# Patient Record
Sex: Male | Born: 1965
Health system: Southern US, Community
[De-identification: ages and names within clinical notes are randomized; demographics above are authoritative.]

## PROBLEM LIST (undated history)

## (undated) DIAGNOSIS — C801 Malignant (primary) neoplasm, unspecified: Secondary | ICD-10-CM

## (undated) DIAGNOSIS — F419 Anxiety disorder, unspecified: Secondary | ICD-10-CM

---

## 1983-08-29 HISTORY — PX: ELBOW FRACTURE SURGERY: SHX616

## 1987-08-29 HISTORY — PX: TESTICLE SURGERY: SHX794

## 2005-05-09 ENCOUNTER — Ambulatory Visit (HOSPITAL_COMMUNITY): Admission: RE | Admit: 2005-05-09 | Discharge: 2005-05-09 | Payer: Self-pay | Admitting: Family Medicine

## 2010-02-01 ENCOUNTER — Ambulatory Visit (HOSPITAL_COMMUNITY): Payer: Self-pay | Admitting: Psychiatry

## 2010-02-03 ENCOUNTER — Ambulatory Visit (HOSPITAL_COMMUNITY): Payer: Self-pay | Admitting: Psychology

## 2010-02-18 ENCOUNTER — Ambulatory Visit (HOSPITAL_COMMUNITY): Payer: Self-pay | Admitting: Psychology

## 2014-03-02 ENCOUNTER — Encounter (HOSPITAL_COMMUNITY): Payer: Self-pay | Admitting: Pharmacy Technician

## 2014-03-02 NOTE — H&P (Signed)
  NTS SOAP Note  Vital Signs:  Vitals as of: 09/01/7260: Systolic 035: Diastolic 96: Heart Rate 82: Temp 98.80F: Height 93ft 11in: Weight 189Lbs 0 Ounces: Pain Level 4: BMI 26.36  BMI : 26.36 kg/m2  Subjective: This 48 Years 1 Months old Male presents for of an incisional hernia.  Is at the umbilicus.  Had laparotomy in remote past.  Hernia is enlarging and causing him discomfort.  Review of Symptoms:  Constitutional:unremarkable      headache Eyes:unremarkable       sinus problems, sore throat Respiratory:  dyspnea,cough Gastrointestin    abdominal pain Genitourinary:    frequency   joint and neck pain Skin:unremarkable Hematolgic/Lymphatic:unremarkable     Allergic/Immunologic:unremarkable     Past Medical History:    Reviewed  Past Medical History  Surgical History: explratory laparotomy in remote past Medical Problems: none Psychiatric History: Anxiety Allergies: nkda Medications: xanax   Social History:Reviewed  Social History  Preferred Language: English Race:  White Ethnicity: Not Hispanic / Latino Age: 48 Years 1 Months Marital Status:  D Alcohol: no   Smoking Status: Current every day smoker reviewed on 02/27/2014 Started Date:  Packs per day: 1.00 Functional Status reviewed on 02/27/2014 ------------------------------------------------ Bathing: Normal Cooking: Normal Dressing: Normal Driving: Normal Eating: Normal Managing Meds: Normal Oral Care: Normal Shopping: Normal Toileting: Normal Transferring: Normal Walking: Normal Cognitive Status reviewed on 02/27/2014 ------------------------------------------------ Attention: Normal Decision Making: Normal Language: Normal Memory: Normal Motor: Normal Perception: Normal Problem Solving: Normal Visual and Spatial: Normal   Family History:  Reviewed  Family Health History Family History is Unknown    Objective Information: General:  Well  appearing, well nourished in no distress. Heart:  RRR, no murmur Lungs:    CTA bilaterally, no wheezes, rhonchi, rales.  Breathing unlabored. Abdomen:Soft, NT/ND, normal bowel sounds, no HSM, no masses.  No peritoneal signs.  Reducible incisional hernia at the level of the umbilicus.  Assessment:Incisional hernia  Diagnoses: 553.21 Incisional hernia (Incisional hernia without obstruction or gangrene)  Procedures: 59741 - OFFICE OUTPATIENT NEW 30 MINUTES    Plan:  Scheduled for incisional herniorrhaphy with mesh on 03/13/14.   Patient Education:Alternative treatments to surgery were discussed with patient (and family).  Risks and benefits  of procedure including bleeding, infection, and recurrence of the hernia were fully explained to the patient (and family) who gave informed consent. Patient/family questions were addressed.  Follow-up:Pending Surgery

## 2014-03-05 NOTE — Patient Instructions (Signed)
Your procedure is scheduled on: March 13, 2014  Report to [facility] at   6:15  AM.  Call this number if you have problems the morning of surgery: [phone]   Remember:   Do not drink or eat food:After Midnight.  :  Take these medicines the morning of surgery with A SIP OF WATER: Xanax   Do not wear jewelry, make-up or nail polish.  Do not wear lotions, powders, or perfumes. You may wear deodorant.  Do not shave 48 hours prior to surgery. Men may shave face and neck.  Do not bring valuables to the hospital.  Contacts, dentures or bridgework may not be worn into surgery.  Leave suitcase in the car. After surgery it may be brought to your room.  For patients admitted to the hospital, checkout time is 11:00 AM the day of discharge.   Patients discharged the day of surgery will not be allowed to drive home.    Special Instructions: Shower using CHG night before surgery and shower the day of surgery use CHG.  Use special wash - you have one bottle of CHG for all showers.  You should use approximately 1/2 of the bottle for each shower.   Please read over the following fact sheets that you were given: Pain Booklet, MRSA Information, Surgical Site Infection Prevention and Care and Recovery After Surgery  Ventral Hernia A ventral hernia (also called an incisional hernia) is a hernia that occurs at the site of a previous surgical cut (incision) in the abdomen. The abdominal wall spans from your lower chest down to your pelvis. If the abdominal wall is weakened from a surgical incision, a hernia can occur. A hernia is a bulge of bowel or muscle tissue pushing out on the weakened part of the abdominal wall. Ventral hernias can get bigger from straining or lifting. Obese and older people are at higher risk for a ventral hernia. People who develop infections after surgery or require repeat incisions at the same site on the abdomen are also at increased risk. CAUSES  A ventral hernia occurs because of  weakness in the abdominal wall at an incision site.  SYMPTOMS  Common symptoms include:  A visible bulge or lump on the abdominal wall.  Pain or tenderness around the lump.  Increased discomfort if you cough or make a sudden movement. If the hernia has blocked part of the intestine, a serious complication can occur (incarcerated or strangulated hernia). This can become a problem that requires emergency surgery because the blood flow to the blocked intestine may be cut off. Symptoms may include:  Feeling sick to your stomach (nauseous).  Throwing up (vomiting).  Stomach swelling (distention) or bloating.  Fever.  Rapid heartbeat. DIAGNOSIS  Your caregiver will take a medical history and perform a physical exam. Various tests may be ordered, such as:  Blood tests.  Urine tests.  Ultrasonography.  X-rays.  Computed tomography (CT). TREATMENT  Watchful waiting may be all that is needed for a smaller hernia that does not cause symptoms. Your caregiver may recommend the use of a supportive belt (truss) that helps to keep the abdominal wall intact. For larger hernias or those that cause pain, surgery to repair the hernia is usually recommended. If a hernia becomes strangulated, emergency surgery needs to be done right away. HOME CARE INSTRUCTIONS  Avoid putting pressure or strain on the abdominal area.  Avoid heavy lifting.  Use good body positioning for physical tasks. Ask your caregiver about proper  body positioning.  Use a supportive belt as directed by your caregiver.  Maintain a healthy weight.  Eat foods that are high in fiber, such as whole grains, fruits, and vegetables. Fiber helps prevent difficult bowel movements (constipation).  Drink enough fluids to keep your urine clear or pale yellow.  Follow up with your caregiver as directed. SEEK MEDICAL CARE IF:   Your hernia seems to be getting larger or more painful. SEEK IMMEDIATE MEDICAL CARE IF:   You have  abdominal pain that is sudden and sharp.  Your pain becomes severe.  You have repeated vomiting.  You are sweating a lot.  You notice a rapid heartbeat.  You develop a fever. MAKE SURE YOU:   Understand these instructions.  Will watch your condition.  Will get help right away if you are not doing well or get worse. Document Released: 07/31/2012 Document Reviewed: 07/31/2012 [facility] Patient Information 2015 [place]. This information is not intended to replace advice given to you by your health care provider. Make sure you discuss any questions you have with your health care provider. PATIENT INSTRUCTIONS POST-ANESTHESIA  IMMEDIATELY FOLLOWING SURGERY:  Do not drive or operate machinery for the first twenty four hours after surgery.  Do not make any important decisions for twenty four hours after surgery or while taking narcotic pain medications or sedatives.  If you develop intractable nausea and vomiting or a severe headache please notify your doctor immediately.  FOLLOW-UP:  Please make an appointment with your surgeon as instructed. You do not need to follow up with anesthesia unless specifically instructed to do so.  WOUND CARE INSTRUCTIONS (if applicable):  Keep a dry clean dressing on the anesthesia/puncture wound site if there is drainage.  Once the wound has quit draining you may leave it open to air.  Generally you should leave the bandage intact for twenty four hours unless there is drainage.  If the epidural site drains for more than 36-48 hours please call the anesthesia department.  QUESTIONS?:  Please feel free to call your physician or the hospital operator if you have any questions, and they will be happy to assist you.

## 2014-03-06 ENCOUNTER — Encounter (HOSPITAL_COMMUNITY): Payer: Self-pay

## 2014-03-06 ENCOUNTER — Other Ambulatory Visit: Payer: Self-pay

## 2014-03-06 ENCOUNTER — Encounter (HOSPITAL_COMMUNITY)
Admission: RE | Admit: 2014-03-06 | Discharge: 2014-03-06 | Disposition: A | Discharge status: Home / Self Care | Payer: BC Managed Care – PPO | Source: Ambulatory Visit | Attending: General Surgery | Admitting: General Surgery

## 2014-03-06 DIAGNOSIS — Z01812 Encounter for preprocedural laboratory examination: Secondary | ICD-10-CM | POA: Insufficient documentation

## 2014-03-06 DIAGNOSIS — Z01810 Encounter for preprocedural cardiovascular examination: Secondary | ICD-10-CM | POA: Insufficient documentation

## 2014-03-06 HISTORY — DX: Malignant (primary) neoplasm, unspecified: C80.1

## 2014-03-06 HISTORY — DX: Anxiety disorder, unspecified: F41.9

## 2014-03-06 LAB — CBC WITH DIFFERENTIAL/PLATELET
BASOS ABS: 0 10*3/uL (ref 0.0–0.1)
Basophils Relative: 0 % (ref 0–1)
EOS PCT: 2 % (ref 0–5)
Eosinophils Absolute: 0.2 10*3/uL (ref 0.0–0.7)
HCT: 46.7 % (ref 39.0–52.0)
HEMOGLOBIN: 16 g/dL (ref 13.0–17.0)
LYMPHS ABS: 2.4 10*3/uL (ref 0.7–4.0)
LYMPHS PCT: 22 % (ref 12–46)
MCH: 31.6 pg (ref 26.0–34.0)
MCHC: 34.3 g/dL (ref 30.0–36.0)
MCV: 92.1 fL (ref 78.0–100.0)
MONOS PCT: 8 % (ref 3–12)
Monocytes Absolute: 0.9 10*3/uL (ref 0.1–1.0)
NEUTROS PCT: 68 % (ref 43–77)
Neutro Abs: 7.5 10*3/uL (ref 1.7–7.7)
PLATELETS: 317 10*3/uL (ref 150–400)
RBC: 5.07 MIL/uL (ref 4.22–5.81)
RDW: 12.7 % (ref 11.5–15.5)
WBC: 11 10*3/uL — ABNORMAL HIGH (ref 4.0–10.5)

## 2014-03-06 LAB — BASIC METABOLIC PANEL
ANION GAP: 11 (ref 5–15)
BUN: 13 mg/dL (ref 6–23)
CO2: 26 meq/L (ref 19–32)
CREATININE: 0.99 mg/dL (ref 0.50–1.35)
Calcium: 9.6 mg/dL (ref 8.4–10.5)
Chloride: 104 mEq/L (ref 96–112)
GFR calc Af Amer: >90 mL/min (ref >90)
GLUCOSE: 105 mg/dL — AB (ref 70–99)
Potassium: 4.1 mEq/L (ref 3.7–5.3)
SODIUM: 141 meq/L (ref 137–147)

## 2014-03-09 ENCOUNTER — Other Ambulatory Visit (HOSPITAL_COMMUNITY): Payer: Self-pay

## 2014-03-13 ENCOUNTER — Encounter (HOSPITAL_COMMUNITY): Payer: Self-pay | Admitting: *Deleted

## 2014-03-13 ENCOUNTER — Encounter (HOSPITAL_COMMUNITY)
Admission: RE | Disposition: A | Discharge status: Home / Self Care | Payer: Self-pay | Source: Ambulatory Visit | Attending: General Surgery

## 2014-03-13 ENCOUNTER — Ambulatory Visit (HOSPITAL_COMMUNITY)
Admission: RE | Admit: 2014-03-13 | Discharge: 2014-03-13 | Disposition: A | Discharge status: Home / Self Care | Payer: BC Managed Care – PPO | Source: Ambulatory Visit | Attending: General Surgery | Admitting: General Surgery

## 2014-03-13 ENCOUNTER — Encounter (HOSPITAL_COMMUNITY): Payer: BC Managed Care – PPO | Admitting: Anesthesiology

## 2014-03-13 ENCOUNTER — Ambulatory Visit (HOSPITAL_COMMUNITY): Payer: BC Managed Care – PPO | Admitting: Anesthesiology

## 2014-03-13 DIAGNOSIS — F17200 Nicotine dependence, unspecified, uncomplicated: Secondary | ICD-10-CM | POA: Insufficient documentation

## 2014-03-13 DIAGNOSIS — F411 Generalized anxiety disorder: Secondary | ICD-10-CM | POA: Insufficient documentation

## 2014-03-13 DIAGNOSIS — K432 Incisional hernia without obstruction or gangrene: Secondary | ICD-10-CM | POA: Insufficient documentation

## 2014-03-13 DIAGNOSIS — Z79899 Other long term (current) drug therapy: Secondary | ICD-10-CM | POA: Insufficient documentation

## 2014-03-13 HISTORY — PX: INCISIONAL HERNIA REPAIR: SHX193

## 2014-03-13 SURGERY — REPAIR, HERNIA, INCISIONAL
Anesthesia: General | Site: Abdomen

## 2014-03-13 MED ORDER — ONDANSETRON HCL 4 MG/2ML IJ SOLN
INTRAMUSCULAR | Status: AC
Start: 2014-03-13 — End: 2014-03-13
  Filled 2014-03-13: qty 2

## 2014-03-13 MED ORDER — FENTANYL CITRATE 0.05 MG/ML IJ SOLN: 25.0000 ug | INTRAMUSCULAR | Status: DC | PRN

## 2014-03-13 MED ORDER — GLYCOPYRROLATE 0.2 MG/ML IJ SOLN
INTRAMUSCULAR | Status: AC
  Filled 2014-03-13: qty 4

## 2014-03-13 MED ORDER — ROCURONIUM BROMIDE 50 MG/5ML IV SOLN
INTRAVENOUS | Status: AC
  Filled 2014-03-13: qty 1

## 2014-03-13 MED ORDER — POVIDONE-IODINE 10 % OINT PACKET
TOPICAL_OINTMENT | CUTANEOUS | Status: DC | PRN
  Administered 2014-03-13: 1 via TOPICAL

## 2014-03-13 MED ORDER — ONDANSETRON HCL 4 MG/2ML IJ SOLN: 4.0000 mg | Freq: Once | INTRAMUSCULAR | Status: DC | PRN

## 2014-03-13 MED ORDER — KETOROLAC TROMETHAMINE 30 MG/ML IJ SOLN
30.0000 mg | Freq: Once | INTRAMUSCULAR | Status: AC
Start: 2014-03-13 — End: 2014-03-13
  Administered 2014-03-13: 30 mg via INTRAVENOUS

## 2014-03-13 MED ORDER — MIDAZOLAM HCL 2 MG/2ML IJ SOLN
INTRAMUSCULAR | Status: AC
  Filled 2014-03-13: qty 2

## 2014-03-13 MED ORDER — FENTANYL CITRATE 0.05 MG/ML IJ SOLN
INTRAMUSCULAR | Status: AC
  Filled 2014-03-13: qty 5

## 2014-03-13 MED ORDER — POVIDONE-IODINE 10 % EX OINT
TOPICAL_OINTMENT | CUTANEOUS | Status: AC
  Filled 2014-03-13: qty 1

## 2014-03-13 MED ORDER — CEFAZOLIN SODIUM-DEXTROSE 2-3 GM-% IV SOLR
2.0000 g | INTRAVENOUS | Status: AC
  Administered 2014-03-13: 2 g via INTRAVENOUS
  Filled 2014-03-13: qty 50

## 2014-03-13 MED ORDER — MIDAZOLAM HCL 2 MG/2ML IJ SOLN
1.0000 mg | INTRAMUSCULAR | Status: DC | PRN
  Administered 2014-03-13: 2 mg via INTRAVENOUS

## 2014-03-13 MED ORDER — ONDANSETRON HCL 4 MG/2ML IJ SOLN
4.0000 mg | Freq: Once | INTRAMUSCULAR | Status: AC
  Administered 2014-03-13: 4 mg via INTRAVENOUS

## 2014-03-13 MED ORDER — ROCURONIUM BROMIDE 100 MG/10ML IV SOLN
INTRAVENOUS | Status: DC | PRN
  Administered 2014-03-13: 10 mg via INTRAVENOUS
  Administered 2014-03-13: 35 mg via INTRAVENOUS

## 2014-03-13 MED ORDER — CHLORHEXIDINE GLUCONATE 4 % EX LIQD: 1.0000 "application " | Freq: Once | CUTANEOUS | Status: DC

## 2014-03-13 MED ORDER — KETOROLAC TROMETHAMINE 30 MG/ML IJ SOLN
INTRAMUSCULAR | Status: AC
  Filled 2014-03-13: qty 1

## 2014-03-13 MED ORDER — BUPIVACAINE LIPOSOME 1.3 % IJ SUSP
INTRAMUSCULAR | Status: DC | PRN
  Administered 2014-03-13: 6 mL

## 2014-03-13 MED ORDER — LIDOCAINE HCL 1 % IJ SOLN
INTRAMUSCULAR | Status: DC | PRN
  Administered 2014-03-13: 30 mg via INTRADERMAL

## 2014-03-13 MED ORDER — FENTANYL CITRATE 0.05 MG/ML IJ SOLN
INTRAMUSCULAR | Status: DC | PRN
  Administered 2014-03-13 (×4): 50 ug via INTRAVENOUS

## 2014-03-13 MED ORDER — PROPOFOL 10 MG/ML IV BOLUS
INTRAVENOUS | Status: AC
Start: 2014-03-13 — End: 2014-03-13
  Filled 2014-03-13: qty 20

## 2014-03-13 MED ORDER — OXYCODONE-ACETAMINOPHEN 7.5-325 MG PO TABS: 1.0000 | ORAL_TABLET | ORAL | Status: DC | PRN

## 2014-03-13 MED ORDER — NEOSTIGMINE METHYLSULFATE 10 MG/10ML IV SOLN
INTRAVENOUS | Status: DC | PRN
  Administered 2014-03-13: 3 mg via INTRAVENOUS

## 2014-03-13 MED ORDER — SODIUM CHLORIDE 0.9 % IR SOLN
Status: DC | PRN
  Administered 2014-03-13: 1000 mL

## 2014-03-13 MED ORDER — BUPIVACAINE HCL (PF) 0.5 % IJ SOLN
INTRAMUSCULAR | Status: AC
  Filled 2014-03-13: qty 30

## 2014-03-13 MED ORDER — LACTATED RINGERS IV SOLN
INTRAVENOUS | Status: DC
  Administered 2014-03-13: 07:00:00 via INTRAVENOUS

## 2014-03-13 MED ORDER — LIDOCAINE HCL (PF) 1 % IJ SOLN
INTRAMUSCULAR | Status: AC
  Filled 2014-03-13: qty 5

## 2014-03-13 MED ORDER — PROPOFOL 10 MG/ML IV BOLUS
INTRAVENOUS | Status: DC | PRN
  Administered 2014-03-13: 150 mg via INTRAVENOUS

## 2014-03-13 MED ORDER — GLYCOPYRROLATE 0.2 MG/ML IJ SOLN
INTRAMUSCULAR | Status: DC | PRN
  Administered 2014-03-13: .5 mg via INTRAVENOUS

## 2014-03-13 MED ORDER — SUCCINYLCHOLINE CHLORIDE 20 MG/ML IJ SOLN
INTRAMUSCULAR | Status: AC
  Filled 2014-03-13: qty 1

## 2014-03-13 SURGICAL SUPPLY — 42 items
BAG HAMPER (MISCELLANEOUS) ×2 IMPLANT
CLOTH BEACON ORANGE TIMEOUT ST (SAFETY) ×2 IMPLANT
COVER LIGHT HANDLE STERIS (MISCELLANEOUS) ×4 IMPLANT
DECANTER SPIKE VIAL GLASS SM (MISCELLANEOUS) ×2 IMPLANT
DURAPREP 26ML APPLICATOR (WOUND CARE) ×2 IMPLANT
ELECT REM PT RETURN 9FT ADLT (ELECTROSURGICAL) ×2
ELECTRODE REM PT RTRN 9FT ADLT (ELECTROSURGICAL) ×1 IMPLANT
FORMALIN 10 PREFIL 480ML (MISCELLANEOUS) IMPLANT
GAUZE SPONGE 4X4 12PLY STRL (GAUZE/BANDAGES/DRESSINGS) ×2 IMPLANT
GLOVE BIOGEL PI IND STRL 7.0 (GLOVE) ×2 IMPLANT
GLOVE BIOGEL PI INDICATOR 7.0 (GLOVE) ×2
GLOVE ECLIPSE 6.5 STRL STRAW (GLOVE) ×4 IMPLANT
GLOVE EXAM NITRILE PF LG BLUE (GLOVE) ×2 IMPLANT
GLOVE SS BIOGEL STRL SZ 6.5 (GLOVE) ×1 IMPLANT
GLOVE SUPERSENSE BIOGEL SZ 6.5 (GLOVE) ×1
GLOVE SURG SS PI 7.5 STRL IVOR (GLOVE) ×2 IMPLANT
GOWN STRL REUS W/TWL LRG LVL3 (GOWN DISPOSABLE) ×6 IMPLANT
INST SET MAJOR GENERAL (KITS) ×2 IMPLANT
KIT ROOM TURNOVER APOR (KITS) ×2 IMPLANT
MANIFOLD NEPTUNE II (INSTRUMENTS) ×2 IMPLANT
MESH VENTRALEX ST 1-7/10 CRC S (Mesh General) ×2 IMPLANT
NEEDLE HYPO 25X1 1.5 SAFETY (NEEDLE) ×2 IMPLANT
NS IRRIG 1000ML POUR BTL (IV SOLUTION) ×2 IMPLANT
PACK ABDOMINAL MAJOR (CUSTOM PROCEDURE TRAY) ×2 IMPLANT
PAD ARMBOARD 7.5X6 YLW CONV (MISCELLANEOUS) ×2 IMPLANT
SET BASIN LINEN APH (SET/KITS/TRAYS/PACK) ×2 IMPLANT
SPONGE GAUZE 4X4 12PLY (GAUZE/BANDAGES/DRESSINGS) ×2 IMPLANT
STAPLER VISISTAT (STAPLE) ×2 IMPLANT
SUT ETHIBOND NAB MO 7 #0 18IN (SUTURE) ×2 IMPLANT
SUT NOVA NAB GS-21 1 T12 (SUTURE) IMPLANT
SUT NOVA NAB GS-22 2 2-0 T-19 (SUTURE) IMPLANT
SUT NOVA NAB GS-26 0 60 (SUTURE) IMPLANT
SUT PROLENE 0 CT 1 CR/8 (SUTURE) IMPLANT
SUT SILK 2 0 (SUTURE)
SUT SILK 2-0 18XBRD TIE 12 (SUTURE) IMPLANT
SUT VIC AB 2-0 CT1 27 (SUTURE) ×2
SUT VIC AB 2-0 CT1 TAPERPNT 27 (SUTURE) ×1 IMPLANT
SUT VIC AB 3-0 SH 27 (SUTURE) ×4
SUT VIC AB 3-0 SH 27X BRD (SUTURE) ×2 IMPLANT
SUT VIC AB 4-0 PS2 27 (SUTURE) IMPLANT
SYR 20CC LL (SYRINGE) ×2 IMPLANT
TAPE CLOTH SURG 4X10 WHT LF (GAUZE/BANDAGES/DRESSINGS) ×2 IMPLANT

## 2014-03-13 NOTE — Anesthesia Preprocedure Evaluation (Addendum)
Anesthesia Evaluation  Patient identified by MRN, date of birth, ID band Patient awake    Reviewed: Allergy & Precautions, H&P , NPO status , Patient's Chart, lab work & pertinent test results  Airway Mallampati: II TM Distance: >3 FB Neck ROM: Full    Dental  (+) Teeth Intact, Partial Upper   Pulmonary Current Smoker (am cough),  breath sounds clear to auscultation        Cardiovascular negative cardio ROS  Rhythm:Regular Rate:Normal     Neuro/Psych PSYCHIATRIC DISORDERS Anxiety    GI/Hepatic negative GI ROS,   Endo/Other    Renal/GU      Musculoskeletal   Abdominal   Peds  Hematology   Anesthesia Other Findings   Reproductive/Obstetrics                          Anesthesia Physical Anesthesia Plan  ASA: II  Anesthesia Plan: General   Post-op Pain Management:    Induction: Intravenous  Airway Management Planned: Oral ETT  Additional Equipment:   Intra-op Plan:   Post-operative Plan: Extubation in OR  Informed Consent: I have reviewed the patients History and Physical, chart, labs and discussed the procedure including the risks, benefits and alternatives for the proposed anesthesia with the patient or authorized representative who has indicated his/her understanding and acceptance.     Plan Discussed with:   Anesthesia Plan Comments:         Anesthesia Quick Evaluation

## 2014-03-13 NOTE — Anesthesia Procedure Notes (Signed)
Procedure Name: Intubation Date/Time: 03/13/2014 7:47 AM Performed by: Tressie Stalker E Pre-anesthesia Checklist: Patient identified, Patient being monitored, Timeout performed, Emergency Drugs available and Suction available Patient Re-evaluated:Patient Re-evaluated prior to inductionOxygen Delivery Method: Circle System Utilized Preoxygenation: Pre-oxygenation with 100% oxygen Intubation Type: IV induction Ventilation: Mask ventilation without difficulty Laryngoscope Size: Mac and 3 Grade View: Grade I Tube type: Oral Tube size: 7.0 mm Number of attempts: 1 Airway Equipment and Method: stylet Placement Confirmation: ETT inserted through vocal cords under direct vision,  positive ETCO2 and breath sounds checked- equal and bilateral Secured at: 22 cm Tube secured with: Tape Dental Injury: Teeth and Oropharynx as per pre-operative assessment

## 2014-03-13 NOTE — Transfer of Care (Signed)
Immediate Anesthesia Transfer of Care Note  Patient: David Bridges  Procedure(s) Performed: Procedure(s): HERNIA REPAIR INCISIONAL WITH MESH (N/A)  Patient Location: PACU  Anesthesia Type:General  Level of Consciousness: awake  Airway & Oxygen Therapy: Patient Spontanous Breathing and Patient connected to face mask oxygen  Post-op Assessment: Report given to PACU RN  Post vital signs: Reviewed and stable  Complications: No apparent anesthesia complications

## 2014-03-13 NOTE — Op Note (Signed)
Patient:  David Bridges  DOB:  07/03/1966  MRN:  [id number]   Preop Diagnosis:  Incisional hernia  Postop Diagnosis:  Same  Procedure:  Incisional herniorrhaphy with mesh  Surgeon:  Aviva Signs, M.D.  Anes:  General endotracheal  Indications:  Patient is a 48 year old white male status post exploratory laparotomy in the past who presents with an incisional hernia around the umbilicus. The risks and benefits of the procedure including bleeding, infection, and recurrence of the hernia were fully explained to the patient, who gave informed consent.  Procedure note:  Patient is placed the supine position. After induction of general endotracheal anesthesia, the abdomen was prepped and draped using usual sterile technique with DuraPrep. Surgical site confirmation was performed.  An infraumbilical incision was made down to the fascia. The umbilicus was freed away from the underlying hernia sac. The patient had omentum within the hernia sac. The previous surgical scar was present in this region. The omentum was reduced and any attachments to the anterior abdominal wall were lysed using Bovie electrocautery. A 4.7 cm ventralax Bard patch was inserted and secured to the abdominal wall using 0 Ethibond interrupted sutures. The overlying fascia was reapproximated in transverse fashion using 0 Ethibond interrupted sutures. The umbilicus was secured back to the fascia using a 2-0 Vicryl interrupted suture. Subcutaneous layer was reapproximated using 3-0 Vicryl interrupted suture. The skin was closed using staples. 0.5% Sensorcaine was instilled the surrounding wound. Betadine ointment and dry sterile dressing were applied.  All tape and needle counts were correct at the end of the procedure. Patient was extubated in the operating room and transferred to PACU in stable condition.  Complications:  None  EBL:  Minimal  Specimen:  None

## 2014-03-13 NOTE — Anesthesia Postprocedure Evaluation (Signed)
  Anesthesia Post-op Note  Patient: David Bridges  Procedure(s) Performed: Procedure(s): HERNIA REPAIR INCISIONAL WITH MESH (N/A)  Patient Location: PACU  Anesthesia Type:General  Level of Consciousness: awake and alert   Airway and Oxygen Therapy: Patient Spontanous Breathing and Patient connected to face mask oxygen  Post-op Pain: mild, fentanyl 50 mcg  Post-op Assessment: Post-op Vital signs reviewed, Patient's Cardiovascular Status Stable, Respiratory Function Stable, Patent Airway and No signs of Nausea or vomiting  Post-op Vital Signs: Reviewed and stable  Last Vitals:  Filed Vitals:   03/13/14 0730  BP: 129/93  Temp:   Resp: 20    Complications: No apparent anesthesia complications

## 2014-03-13 NOTE — Interval H&P Note (Signed)
History and Physical Interval Note:  03/13/2014 7:24 AM  David Bridges  has presented today for surgery, with the diagnosis of incisional hernia  The various methods of treatment have been discussed with the patient and family. After consideration of risks, benefits and other options for treatment, the patient has consented to  Procedure(s): HERNIA REPAIR INCISIONAL WITH MESH (N/A) as a surgical intervention .  The patient's history has been reviewed, patient examined, no change in status, stable for surgery.  I have reviewed the patient's chart and labs.  Questions were answered to the patient's satisfaction.     Aviva Signs A

## 2014-03-13 NOTE — Discharge Instructions (Signed)
Open Hernia Repair, Care After °Refer to this sheet in the next few weeks. These instructions provide you with information on caring for yourself after your procedure. Your health care provider may also give you more specific instructions. Your treatment has been planned according to current medical practices, but problems sometimes occur. Call your health care provider if you have any problems or questions after your procedure. °WHAT TO EXPECT AFTER THE PROCEDURE °After your procedure, it is typical to have the following: °· Pain in your abdomen, especially along your incision. You will be given pain medicines to control the pain. °· Constipation. You may be given a stool softener to help prevent this. °HOME CARE INSTRUCTIONS  °· Only take over-the-counter or prescription medicines as directed by your health care provider. °· Keep the wound dry and clean. You may wash the wound gently with soap and water 48 hours after surgery. Gently blot or dab the wound dry. Do not take baths, use swimming pools, or use hot tubs for 10 days or until your health care provider approves. °· Change bandages (dressings) as directed by your health care provider. °· Continue your normal diet as directed by your health care provider. Eat plenty of fruits and vegetables to help prevent constipation. °· Drink enough fluids to keep your urine clear or pale yellow. This also helps prevent constipation. °· Do not drive until your health care provider says it is okay. °· Do not lift anything heavier than 10 pounds (4.5 kg) or play contact sports for 4 weeks or until your health care provider approves. °· Follow up with your health care provider as directed. Ask your health care provider when to make an appointment to have your stitches (sutures) or staples removed. °SEEK MEDICAL CARE IF:  °· You have increased bleeding coming from the incision site. °· You have blood in your stool. °· You have increasing pain in the wound. °· You see redness  or swelling in the wound. °· You have fluid (pus) coming from the wound. °· You have a fever. °· You notice a bad smell coming from the wound or dressing. °SEEK IMMEDIATE MEDICAL CARE IF:  °· You develop a rash. °· You have chest pain or shortness of breath. °· You feel lightheaded or feel faint. °Document Released: 03/03/2005 Document Revised: 06/04/2013 Document Reviewed: 03/26/2013 °[employer]® Patient Information ©2015 [employer]. This information is not intended to replace advice given to you by your health care provider. Make sure you discuss any questions you have with your health care provider. ° °

## 2014-10-07 ENCOUNTER — Other Ambulatory Visit (HOSPITAL_COMMUNITY): Payer: Self-pay | Admitting: Pulmonary Disease

## 2014-10-07 DIAGNOSIS — M542 Cervicalgia: Secondary | ICD-10-CM

## 2014-10-16 ENCOUNTER — Ambulatory Visit (HOSPITAL_COMMUNITY): Payer: BLUE CROSS/BLUE SHIELD

## 2015-08-25 ENCOUNTER — Emergency Department (HOSPITAL_COMMUNITY)
Admission: EM | Admit: 2015-08-25 | Discharge: 2015-08-25 | Disposition: A | Discharge status: Home / Self Care | Payer: Worker's Compensation | Attending: Emergency Medicine | Admitting: Emergency Medicine

## 2015-08-25 ENCOUNTER — Emergency Department (HOSPITAL_COMMUNITY): Payer: Worker's Compensation

## 2015-08-25 ENCOUNTER — Encounter (HOSPITAL_COMMUNITY): Payer: Self-pay | Admitting: Emergency Medicine

## 2015-08-25 DIAGNOSIS — F419 Anxiety disorder, unspecified: Secondary | ICD-10-CM | POA: Insufficient documentation

## 2015-08-25 DIAGNOSIS — Y9289 Other specified places as the place of occurrence of the external cause: Secondary | ICD-10-CM | POA: Insufficient documentation

## 2015-08-25 DIAGNOSIS — S3992XA Unspecified injury of lower back, initial encounter: Secondary | ICD-10-CM | POA: Insufficient documentation

## 2015-08-25 DIAGNOSIS — M25511 Pain in right shoulder: Secondary | ICD-10-CM

## 2015-08-25 DIAGNOSIS — Z8547 Personal history of malignant neoplasm of testis: Secondary | ICD-10-CM | POA: Diagnosis not present

## 2015-08-25 DIAGNOSIS — F17200 Nicotine dependence, unspecified, uncomplicated: Secondary | ICD-10-CM | POA: Insufficient documentation

## 2015-08-25 DIAGNOSIS — Y998 Other external cause status: Secondary | ICD-10-CM | POA: Diagnosis not present

## 2015-08-25 DIAGNOSIS — W208XXA Other cause of strike by thrown, projected or falling object, initial encounter: Secondary | ICD-10-CM | POA: Insufficient documentation

## 2015-08-25 DIAGNOSIS — Y9389 Activity, other specified: Secondary | ICD-10-CM | POA: Insufficient documentation

## 2015-08-25 DIAGNOSIS — S4991XA Unspecified injury of right shoulder and upper arm, initial encounter: Secondary | ICD-10-CM | POA: Insufficient documentation

## 2015-08-25 DIAGNOSIS — Z79899 Other long term (current) drug therapy: Secondary | ICD-10-CM | POA: Insufficient documentation

## 2015-08-25 MED ORDER — OXYCODONE-ACETAMINOPHEN 5-325 MG PO TABS: 1.0000 | ORAL_TABLET | ORAL | Status: DC | PRN

## 2015-08-25 NOTE — Discharge Instructions (Signed)
Shoulder Pain The shoulder is the joint that connects your arms to your body. The bones that form the shoulder joint include the upper arm bone (humerus), the shoulder blade (scapula), and the collarbone (clavicle). The top of the humerus is shaped like a ball and fits into a rather flat socket on the scapula (glenoid cavity). A combination of muscles and strong, fibrous tissues that connect muscles to bones (tendons) support your shoulder joint and hold the ball in the socket. Small, fluid-filled sacs (bursae) are located in different areas of the joint. They act as cushions between the bones and the overlying soft tissues and help reduce friction between the gliding tendons and the bone as you move your arm. Your shoulder joint allows a wide range of motion in your arm. This range of motion allows you to do things like scratch your back or throw a ball. However, this range of motion also makes your shoulder more prone to pain from overuse and injury. Causes of shoulder pain can originate from both injury and overuse and usually can be grouped in the following four categories:  Redness, swelling, and pain (inflammation) of the tendon (tendinitis) or the bursae (bursitis).  Instability, such as a dislocation of the joint.  Inflammation of the joint (arthritis).  Broken bone (fracture). HOME CARE INSTRUCTIONS   Apply ice to the sore area.  Put ice in a plastic bag.  Place a towel between your skin and the bag.  Leave the ice on for 15-20 minutes, 3-4 times per day for the first 2 days, or as directed by your health care provider.  Stop using cold packs if they do not help with the pain.  If you have a shoulder sling or immobilizer, wear it as long as your caregiver instructs. Only remove it to shower or bathe. Move your arm as little as possible, but keep your hand moving to prevent swelling.  Squeeze a soft ball or foam pad as much as possible to help prevent swelling.  Only take  over-the-counter or prescription medicines for pain, discomfort, or fever as directed by your caregiver. SEEK MEDICAL CARE IF:   Your shoulder pain increases, or new pain develops in your arm, hand, or fingers.  Your hand or fingers become cold and numb.  Your pain is not relieved with medicines. SEEK IMMEDIATE MEDICAL CARE IF:   Your arm, hand, or fingers are numb or tingling.  Your arm, hand, or fingers are significantly swollen or turn white or blue. MAKE SURE YOU:   Understand these instructions.  Will watch your condition.  Will get help right away if you are not doing well or get worse.   This information is not intended to replace advice given to you by your health care provider. Make sure you discuss any questions you have with your health care provider.   Document Released: 05/24/2005 Document Revised: 09/04/2014 Document Reviewed: 12/07/2014 Elsevier Interactive Patient Education [employer].   As discussed,  You may simply have deep bruising of your shoulder, but I recommend a recheck by Dr. Aline Brochure - please call for an appointment.  In the interim,  Use ice as much as is comfortable,  Continue using your aleve every 12 hours and the percocet if needed for pain relief.  Do not drive within 4 hours of taking this as it will make you sleepy.  Wear the sling for comfort.  Your blood pressure is elevated today at 152/101 and may be related to your pain and  injury.  You should have this rechecked once your pain is improved.

## 2015-08-25 NOTE — ED Notes (Signed)
Patient states he was at work yesterday and a 6" pipe fell approximately 12 feet hitting his right arm. Complaining of pain to right shoulder, right upper arm, back right shoulder blade "over to the middle of my back."

## 2015-08-26 NOTE — ED Provider Notes (Signed)
CSN: [id number]     Arrival date & time 08/25/15  1636 History   First MD Initiated Contact with Patient 08/25/15 1739     Chief Complaint  Patient presents with  . Arm Injury  . Back Pain     (Consider location/radiation/quality/duration/timing/severity/associated sxs/prior Treatment) The history is provided by the patient.   David Bridges is a 49 y.o. male presenting for evaluation of right shoulder and upper back pain. He sustained injury yesterday at work.  He and a coworker were attempting to remove a 6" round approximately 1' long pipe from the ceiling (12').  He describes being on the ground, holding one end of the pipe on his right shoulder while his coworker attempted to loosen the other end.  The other end fell, bouncing once against his shoulder before he was able to jump out of the way.  He endorses persistent deep aching pain in the shoulder which radiates to his right shoulder blade.  He denies numbness or weakness in his arms, and denies midline neck or back pain, also no chest pain or sob although the injury initially "knocked the breath out of me".  His pain  worsens with attempts at movement, especially raising arm to shoulder height which he cannot do currently. He is right handed.  He has taken aleve and ibuprofen without relief, also took one oxycodone which he had leftover from a .prior surgery which barely improved his symptoms.    Past Medical History  Diagnosis Date  . Anxiety   . Cancer [facility])     right testicle and lymph nodes   Past Surgical History  Procedure Laterality Date  . Testicle surgery Right 1989  . Elbow fracture surgery Right 1985  . Incisional hernia repair N/A 03/13/2014    Procedure: HERNIA REPAIR INCISIONAL WITH MESH;  Surgeon: Jamesetta So, MD;  Location: [facility];  Service: General;  Laterality: N/A;   History reviewed. No pertinent family history. Social History  Substance Use Topics  . Smoking status: Current Every Day Smoker -- 1.50  packs/day for 15 years  . Smokeless tobacco: None  . Alcohol Use: No    Review of Systems  Constitutional: Negative for fever.  Musculoskeletal: Positive for arthralgias. Negative for myalgias and joint swelling.  Neurological: Negative for weakness and numbness.      Allergies  Review of patient's allergies indicates no known allergies.  Home Medications   Prior to Admission medications   Medication Sig Start Date End Date Taking? Authorizing Provider  ALPRAZolam Duanne Moron) 1 MG tablet Take 1 mg by mouth 2 (two) times daily.   Yes Historical Provider, MD  oxyCODONE-acetaminophen (PERCOCET) 10-325 MG tablet Take 1 tablet by mouth every 6 (six) hours as needed. pain 08/05/15  Yes Historical Provider, MD  ibuprofen (ADVIL,MOTRIN) 200 MG tablet Take 400 mg by mouth daily as needed for headache or moderate pain.    Historical Provider, MD  naproxen sodium (ANAPROX) 220 MG tablet Take 220 mg by mouth daily as needed (headache/pain).    Historical Provider, MD  oxyCODONE-acetaminophen (PERCOCET/ROXICET) 5-325 MG tablet Take 1 tablet by mouth every 4 (four) hours as needed. 08/25/15   Evalee Jefferson, PA-C  Pseudoephedrine-Ibuprofen (ADVIL COLD & SINUS LIQUI-GELS PO) Take by mouth. Reported on 08/25/2015    Historical Provider, MD   BP 149/99 mmHg  Pulse 98  Temp(Src) 98.1 F (36.7 C) (Oral)  Resp 16  Ht 5\' 11"  (1.803 m)  Wt 87.998 kg  BMI 27.07 kg/m2  SpO2  99% Physical Exam  Constitutional: He appears well-developed and well-nourished.  HENT:  Head: Atraumatic.  Neck: Normal range of motion.  Cardiovascular:  Pulses equal bilaterally  Musculoskeletal: He exhibits tenderness. He exhibits no edema.       Right shoulder: He exhibits bony tenderness and pain. He exhibits no swelling, no deformity, normal pulse and normal strength.       Cervical back: He exhibits no bony tenderness.       Thoracic back: He exhibits no bony tenderness.  ttp superior right shoulder through right lower  scapula.  Mild edema noted along upper trapezius, no induration, no bruising. No palpable deformity.  Neurological: He is alert. He has normal strength. He displays normal reflexes. No sensory deficit.  Equal grip strength.  Skin: Skin is warm and dry.  Psychiatric: He has a normal mood and affect.    ED Course  Procedures (including critical care time) Labs Review Labs Reviewed - No data to display  Imaging Review Dg Scapula Right  08/25/2015  CLINICAL DATA:  Recent blunt trauma with metal pipe hitting shoulder yesterday, initial encounter EXAM: RIGHT SCAPULA - 2+ VIEWS COMPARISON:  None. FINDINGS: Well corticated bony densities are noted adjacent to the distal clavicle which appear to be related to prior trauma. Correlation to point tenderness may be helpful. No other fracture or dislocation is seen. IMPRESSION: Old trauma without acute abnormality. Electronically Signed   By: Inez Catalina M.D.   On: 08/25/2015 17:26   Dg Shoulder Right  08/25/2015  CLINICAL DATA:  Right shoulder pain following blunt trauma with metal pipe yesterday, initial encounter EXAM: RIGHT SHOULDER - 2+ VIEW COMPARISON:  None. FINDINGS: There is are a few well corticated bony densities adjacent to the distal clavicle likely related to prior trauma. No other focal abnormality is seen. IMPRESSION: No acute abnormality noted. Electronically Signed   By: Inez Catalina M.D.   On: 08/25/2015 17:25   I have personally reviewed and evaluated these images and lab results as part of my medical decision-making.   EKG Interpretation None      MDM   Final diagnoses:  Shoulder pain, acute, right    Imaging reviewed and discussed with pt. He has mild pain at his ac joint where the bony density on imaging is seen, but pain is worst more distally to this site.  Burtis Junes this being old injury.  However,  [name], prescribed oxycodone, advised continued nsaid (discussed either aleve or ibu, not both).  F/u with  ortho, referral given.  Ice.    Evalee Jefferson, PA-C 08/26/15 [place], MD 08/26/15 2350

## 2015-09-03 ENCOUNTER — Telehealth: Payer: Self-pay | Admitting: Orthopedic Surgery

## 2015-09-03 NOTE — Telephone Encounter (Addendum)
Spoke with patient initially on 09/02/15, regarding [facility] visit 08/25/15 for problem of acute right shoulder pain - due to work-related injury.  Patient states he travels with his job at [employer].  Reviewed with patient the Workers comp protocol, including the completion of forms by both employer and patient, and of the possibility that our provider, Dr Harrison/Minor system may not be on their company panel provider list; therefore, appointment pending Workers comp forms and employer response.  Patient has brought to office his employer contact information; forms faxed accordingly, to [phone], Tandy Gaw, Architectural technologist.  Appointment pending Workers Clinical biochemist.  Patient aware.

## 2015-09-07 NOTE — Telephone Encounter (Signed)
Called back to patient and his employer to follow up on status; patient has not yet heard anything; and I left a voice message at his employer's ph# noted, for Ms. Redmond.

## 2015-09-13 NOTE — Telephone Encounter (Signed)
Received Workers comp forms and Franchot Erichsen [id number], per employer [employer], [employer]andy [employer],  Fax# [phone], ph# [phone].  Patient and employer aware of appointment scheduled 09/23/15, 2:30pm

## 2015-09-23 ENCOUNTER — Encounter: Payer: Self-pay | Admitting: Orthopedic Surgery

## 2015-09-23 ENCOUNTER — Ambulatory Visit (INDEPENDENT_AMBULATORY_CARE_PROVIDER_SITE_OTHER): Payer: Worker's Compensation | Admitting: Orthopedic Surgery

## 2015-09-23 VITALS — BP 137/98 | Ht 71.0 in | Wt 194.0 lb

## 2015-09-23 DIAGNOSIS — M25511 Pain in right shoulder: Secondary | ICD-10-CM

## 2015-09-23 DIAGNOSIS — M542 Cervicalgia: Secondary | ICD-10-CM

## 2015-09-23 NOTE — Progress Notes (Signed)
Patient ID: David Bridges, male   DOB: August 04, 1966, 50 y.o.   MRN: [id number]  Chief Complaint  Patient presents with  . Follow-up    er follow up Rt shoulder, blunt trauma, DOI 08/25/15    HPI David Bridges is a 50 y.o. male.   HPI presents for evaluation of his right shoulder. The patient didn't really have blunt trauma. What he describes it as he was holding a heavy pipe on his shoulder the other end of the pipe in the ceiling the other into the pipe fell down knocking him to the ground on 08/24/2015. He went to the ER on 28th of December and had an x-ray of his shoulder and neck and they were both normal other than some old trauma at the distal clavicle  He comes in today complaining of neck pain and shoulder pain and a prominence over his distal clavicle which he is concerned about. 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 He is also been on Percocet 10 mg, Aleve, muscle relaxer and he says none of them helped his pain  Review of Systems Review of Systems  Constitutional: Negative for fever.  Musculoskeletal: Positive for neck pain.  Neurological: Positive for weakness. Negative for numbness.     Past Medical History  Diagnosis Date  . Anxiety   . Cancer [facility])     right testicle and lymph nodes    Past Surgical History  Procedure Laterality Date  . Testicle surgery Right 1989  . Elbow fracture surgery Right 1985  . Incisional hernia repair N/A 03/13/2014    Procedure: HERNIA REPAIR INCISIONAL WITH MESH;  Surgeon: Jamesetta So, MD;  Location: [facility];  Service: General;  Laterality: N/A;    No family history on file.  Social History Social History  Substance Use Topics  . Smoking status: Current Every Day Smoker -- 1.50 packs/day for 15 years  . Smokeless tobacco: Not on file  . Alcohol Use: No    No Known Allergies  Current Outpatient  Prescriptions  Medication Sig Dispense Refill  . ALPRAZolam (XANAX) 1 MG tablet Take 1 mg by mouth 2 (two) times daily.    Marland Kitchen oxyCODONE-acetaminophen (PERCOCET) 10-325 MG tablet Take 1 tablet by mouth every 6 (six) hours as needed. pain     No current facility-administered medications for this visit.       Physical Exam Height 5\' 11"  (1.803 m), weight 194 lb (87.998 kg). Physical Exam The patient is well developed well nourished and well groomed.  Orientation to person place and time is normal  Mood is confrontational  Ambulatory status normal Cervical spine exam is as follows stiffness and rotation and tenderness on the right side of the cervical spine including the right trapezius  Right shoulder  Examination: Inspection reveals tenderness around the [facility] joint. The patient has decreased range of motion and grade 5-/ 5 motor function of the rotator cuff. Stability in abduction external rotation is normal.  In terms of his prominence over his before meals joint. That appears to be chronic based on x-ray however no prior films are available to compare   Neurovascular examination is intact and the lymph nodes in the axilla and supraclavicular regions are normal   The opposite shoulder exhibits normal range of motion stability and strength neurovascular exam is intact, lymph nodes are negative and there is  no swelling or tenderness   Data Reviewed IMAGING: AP/LAT right Shoulder: I have read and interpret the x-ray as follows:   no acute findings are noted  Scapula film no acute findings as well   Assessment    I'm not really sure what happened to him. He has no fracture he has no evidence of any bony injury. He has no mechanism for rotator cuff tear. He has no neurologic findings to suggest injury.  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 I see no reason to stop that  The patient became upset and wanted to be referred to another doctor and by all means we are in favor of that   Plan    Recommend physical therapy. 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

## 2015-09-23 NOTE — Patient Instructions (Signed)
Physical therapy ordered  Continue light duty  Patient requests second opinion elsewhere

## 2015-10-01 ENCOUNTER — Telehealth: Payer: Self-pay | Admitting: Orthopedic Surgery

## 2015-10-20 ENCOUNTER — Telehealth: Payer: Self-pay | Admitting: Orthopedic Surgery

## 2015-10-20 NOTE — Telephone Encounter (Signed)
Per [facility], [place] location and per [place] workers' comp third party contact, therapy has been approved; appointment scheduled today, 10/01/15; patient aware* * Patient also stopped into office to relay that he was attending physical therapy as ordered and approved.

## 2015-10-20 NOTE — Telephone Encounter (Signed)
Call received from [place] at [place] [phone], requesting the office visit note for this patient; states also requested from Workers comp nurse case Freight forwarder; patient has been scheduled for second opinion evaluation tomorrow.  Called Workers comp, [employer], [phone] (also [phone]) and left voice message for adjuster Romero Belling to follow up.

## 2015-10-21 NOTE — Telephone Encounter (Signed)
No response from Workers Production assistant, radio.  Patient aware of appointment.

## 2017-06-12 IMAGING — DX DG SHOULDER 2+V*R*
3 series · 3 of 3 positions shown · non-contrast
Comparison: None.

CLINICAL DATA: Right shoulder pain following blunt trauma with
metal pipe yesterday, initial encounter

EXAM:
RIGHT SHOULDER - 2+ VIEW

[shoulder grashey]
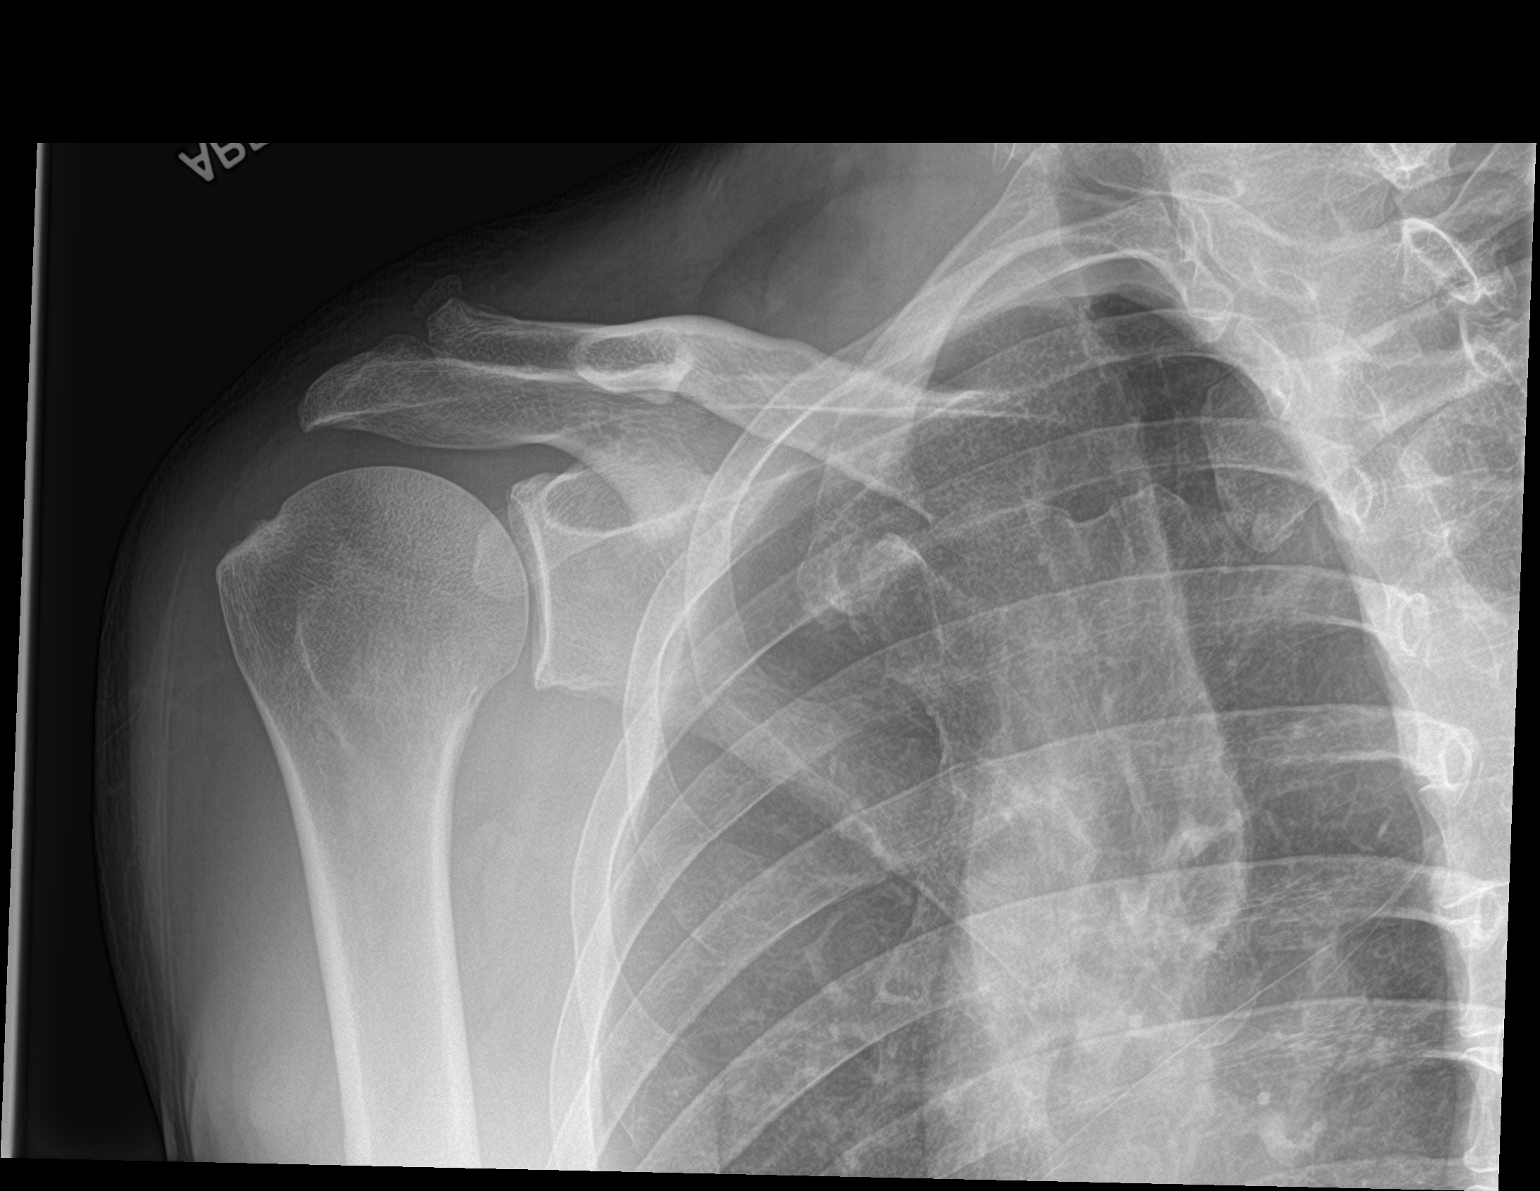

[shoulder y view]
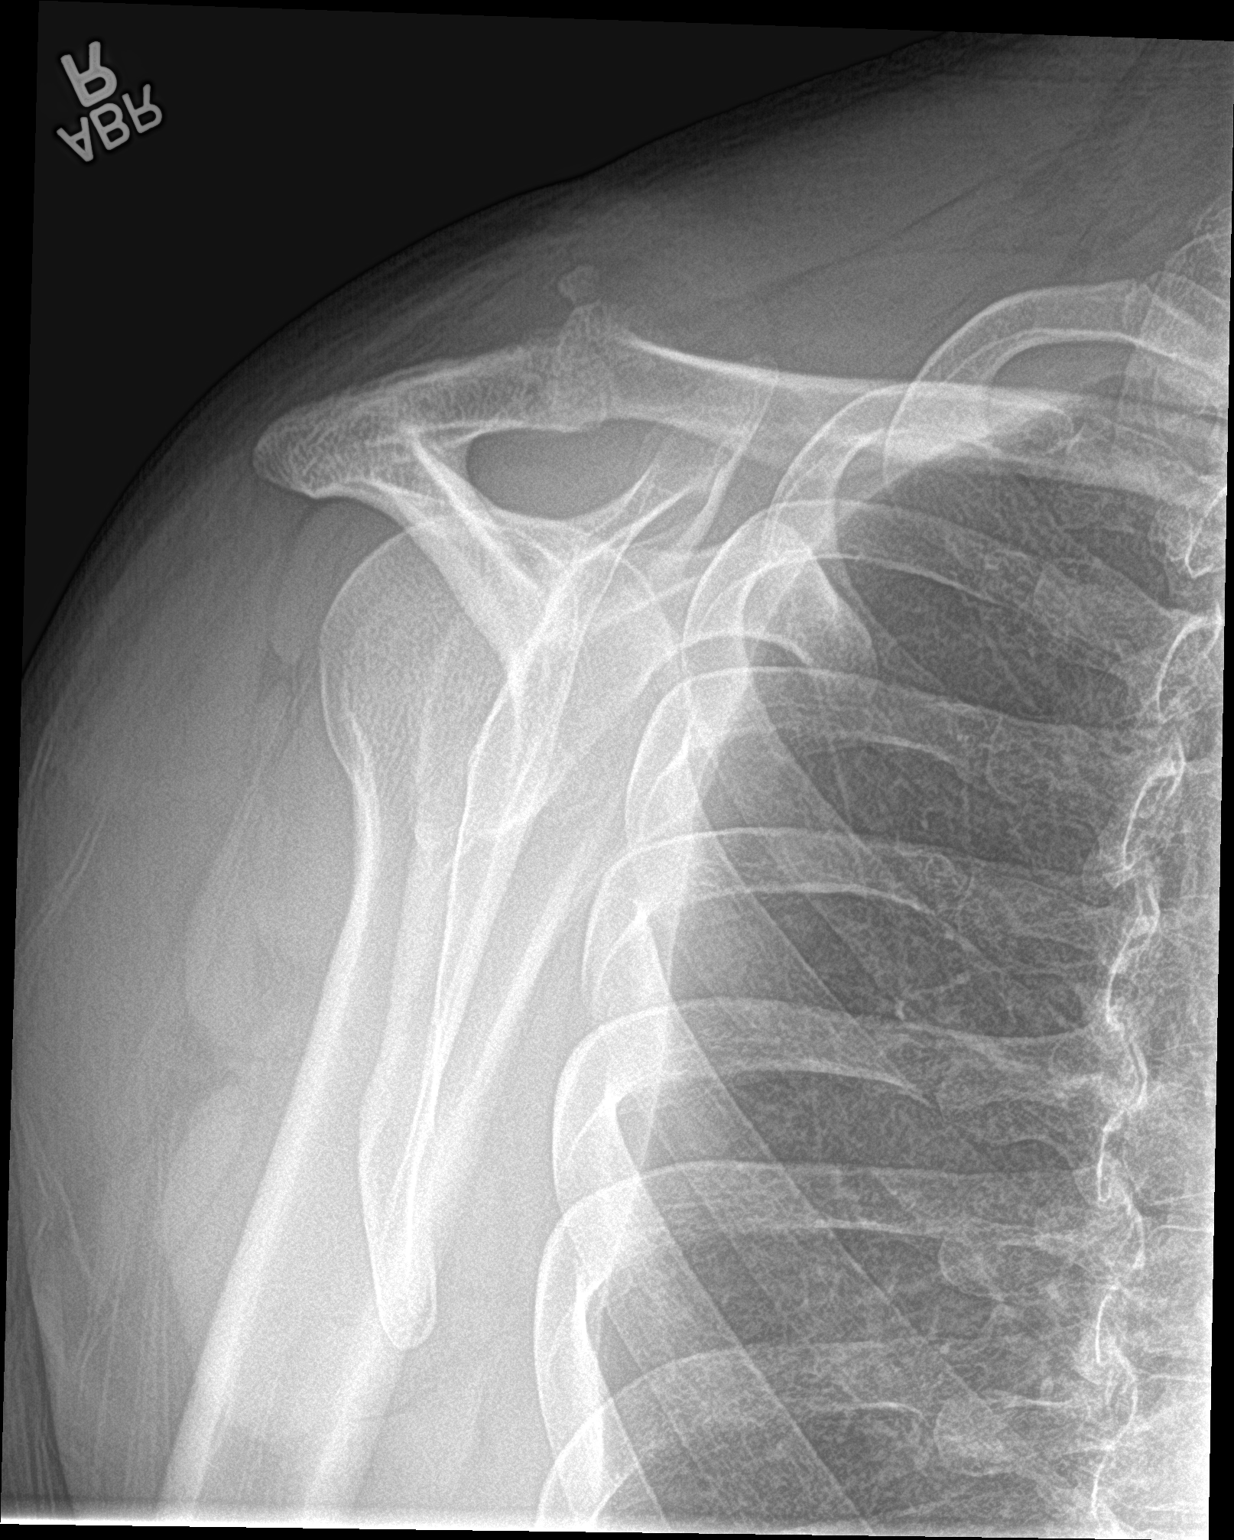

[shoulder axillary]
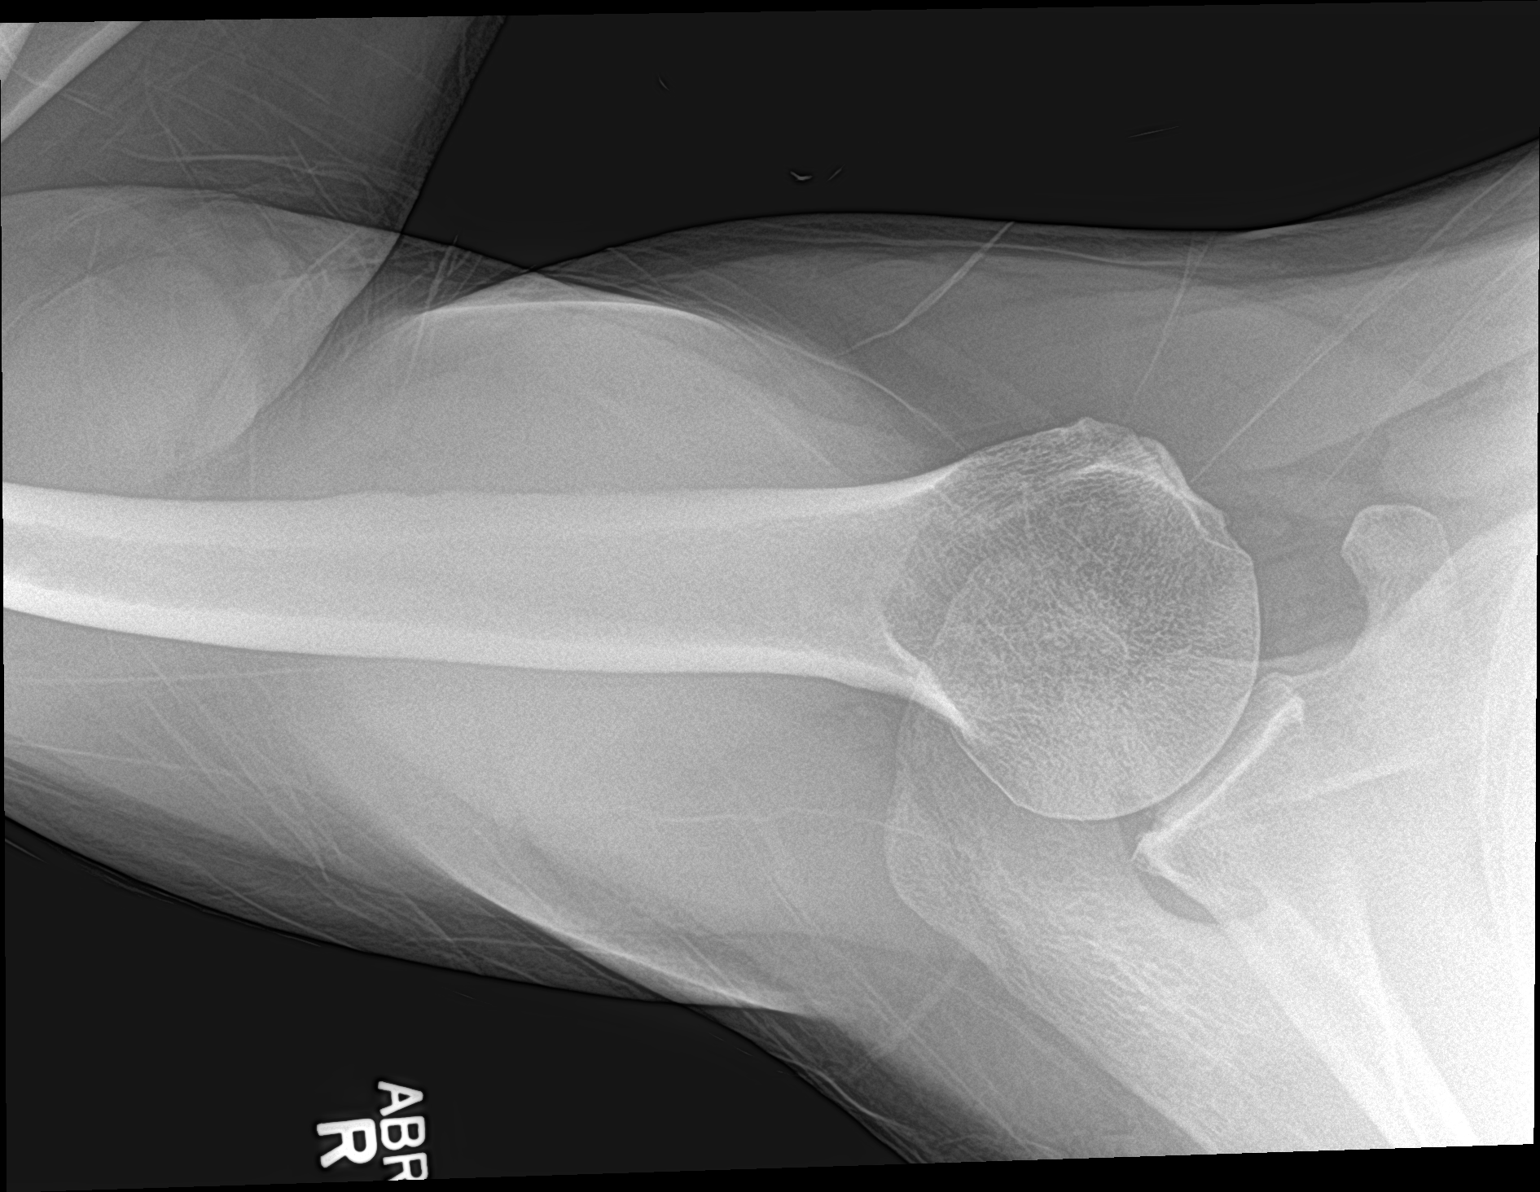

[3 of 3 positions shown; findings below may reference images not displayed]

FINDINGS: There is are a few well corticated bony densities adjacent to the
distal clavicle likely related to prior trauma. No other focal
abnormality is seen.
IMPRESSION: No acute abnormality noted.

## 2017-09-21 ENCOUNTER — Other Ambulatory Visit (HOSPITAL_COMMUNITY)
Admission: RE | Admit: 2017-09-21 | Discharge: 2017-09-21 | Disposition: A | Discharge status: Home / Self Care | Payer: BLUE CROSS/BLUE SHIELD | Source: Ambulatory Visit | Attending: Pulmonary Disease | Admitting: Pulmonary Disease

## 2017-09-21 DIAGNOSIS — Z79891 Long term (current) use of opiate analgesic: Secondary | ICD-10-CM | POA: Diagnosis present

## 2017-09-21 LAB — RAPID URINE DRUG SCREEN, HOSP PERFORMED
Amphetamines: NOT DETECTED
BARBITURATES: NOT DETECTED
Benzodiazepines: POSITIVE — AB
Cocaine: NOT DETECTED
Opiates: NOT DETECTED
Tetrahydrocannabinol: POSITIVE — AB

## 2017-11-02 ENCOUNTER — Other Ambulatory Visit: Payer: Self-pay | Admitting: Orthopedic Surgery

## 2017-11-06 ENCOUNTER — Inpatient Hospital Stay (HOSPITAL_COMMUNITY)
Admission: RE | Admit: 2017-11-06 | Payer: Worker's Compensation | Source: Ambulatory Visit | Admitting: Orthopedic Surgery

## 2017-11-06 ENCOUNTER — Encounter (HOSPITAL_COMMUNITY): Admission: RE | Payer: Self-pay | Source: Ambulatory Visit

## 2017-11-06 SURGERY — ANTERIOR CERVICAL DECOMPRESSION/DISCECTOMY FUSION 1 LEVEL
Anesthesia: General | Laterality: Bilateral

## 2024-02-14 ENCOUNTER — Other Ambulatory Visit: Payer: Self-pay | Admitting: Nurse Practitioner
